# Patient Record
Sex: Male | Born: 1980
Health system: Southern US, Community
[De-identification: ages and names within clinical notes are randomized; demographics above are authoritative.]

---

## 2016-01-16 DIAGNOSIS — H1011 Acute atopic conjunctivitis, right eye: Secondary | ICD-10-CM | POA: Diagnosis not present

## 2017-03-06 DIAGNOSIS — I208 Other forms of angina pectoris: Secondary | ICD-10-CM | POA: Diagnosis not present

## 2017-03-23 DIAGNOSIS — R0789 Other chest pain: Secondary | ICD-10-CM | POA: Diagnosis not present

## 2017-04-27 ENCOUNTER — Ambulatory Visit (INDEPENDENT_AMBULATORY_CARE_PROVIDER_SITE_OTHER): Payer: BLUE CROSS/BLUE SHIELD | Admitting: *Deleted

## 2017-04-27 DIAGNOSIS — Z23 Encounter for immunization: Secondary | ICD-10-CM | POA: Diagnosis not present

## 2017-05-06 DIAGNOSIS — E785 Hyperlipidemia, unspecified: Secondary | ICD-10-CM | POA: Diagnosis not present

## 2017-06-08 ENCOUNTER — Ambulatory Visit (INDEPENDENT_AMBULATORY_CARE_PROVIDER_SITE_OTHER): Payer: BLUE CROSS/BLUE SHIELD | Admitting: *Deleted

## 2017-06-08 DIAGNOSIS — Z23 Encounter for immunization: Secondary | ICD-10-CM | POA: Diagnosis not present

## 2018-04-29 DIAGNOSIS — E785 Hyperlipidemia, unspecified: Secondary | ICD-10-CM | POA: Diagnosis not present

## 2018-04-29 DIAGNOSIS — Z0000 Encounter for general adult medical examination without abnormal findings: Secondary | ICD-10-CM | POA: Diagnosis not present

## 2018-05-31 DIAGNOSIS — R748 Abnormal levels of other serum enzymes: Secondary | ICD-10-CM | POA: Diagnosis not present

## 2018-06-21 ENCOUNTER — Encounter: Payer: Self-pay | Admitting: Emergency Medicine

## 2018-06-21 ENCOUNTER — Other Ambulatory Visit: Payer: Self-pay

## 2018-06-21 ENCOUNTER — Emergency Department
Admission: EM | Admit: 2018-06-21 | Discharge: 2018-06-21 | Disposition: A | Discharge status: Home / Self Care | Payer: BLUE CROSS/BLUE SHIELD | Attending: Emergency Medicine | Admitting: Emergency Medicine

## 2018-06-21 ENCOUNTER — Emergency Department: Payer: BLUE CROSS/BLUE SHIELD

## 2018-06-21 DIAGNOSIS — M545 Low back pain, unspecified: Secondary | ICD-10-CM

## 2018-06-21 DIAGNOSIS — M549 Dorsalgia, unspecified: Secondary | ICD-10-CM | POA: Diagnosis not present

## 2018-06-21 DIAGNOSIS — M62830 Muscle spasm of back: Secondary | ICD-10-CM | POA: Diagnosis not present

## 2018-06-21 MED ORDER — OXYCODONE-ACETAMINOPHEN 5-325 MG PO TABS: 1.0000 | ORAL_TABLET | Freq: Four times a day (QID) | ORAL | 0 refills | Status: AC | PRN

## 2018-06-21 MED ORDER — METHOCARBAMOL 500 MG PO TABS
ORAL_TABLET | ORAL | 0 refills | Status: AC
Start: 2018-06-21 — End: ?

## 2018-06-21 MED ORDER — ORPHENADRINE CITRATE 30 MG/ML IJ SOLN
60.0000 mg | Freq: Two times a day (BID) | INTRAMUSCULAR | Status: DC
Start: 2018-06-21 — End: 2018-06-21
  Administered 2018-06-21: 60 mg via INTRAMUSCULAR
  Filled 2018-06-21: qty 2

## 2018-06-21 MED ORDER — PREDNISONE 10 MG PO TABS: ORAL_TABLET | ORAL | 0 refills | Status: AC

## 2018-06-21 MED ORDER — OXYCODONE-ACETAMINOPHEN 5-325 MG PO TABS
1.0000 | ORAL_TABLET | Freq: Once | ORAL | Status: AC
  Administered 2018-06-21: 1 via ORAL
  Filled 2018-06-21: qty 1

## 2018-06-21 MED ORDER — DEXAMETHASONE SODIUM PHOSPHATE 10 MG/ML IJ SOLN
10.0000 mg | Freq: Once | INTRAMUSCULAR | Status: AC
  Administered 2018-06-21: 10 mg via INTRAMUSCULAR
  Filled 2018-06-21: qty 1

## 2018-06-21 NOTE — ED Notes (Signed)
First Nurse Note: Patient complaining of lower back pain, helped from vehicle into WC.  Unable to sit on left side.

## 2018-06-21 NOTE — ED Notes (Signed)
See triage note presents with family with lower back pain  States hx of back pain which flares up occasionally   States this am started 2 days ago  Thinks he may have twisted wrong  States pain is non radiating  Also took 3 OTC Aleve PTA

## 2018-06-21 NOTE — Discharge Instructions (Addendum)
Follow-up with your primary care provider if any continued problems in 4 to 5 days.  Begin taking medication as directed.  Robaxin 500 mg 1 or 2 tablets every 6 hours as needed for muscle spasms, Percocet as needed for pain, and begin taking prednisone as a tapering dose as directed.  You may use ice or heat to your back as needed for discomfort.  Use crutches as needed for as support.  Return to the emergency department if any incontinence of bowel or bladder.

## 2018-06-21 NOTE — ED Provider Notes (Signed)
[facility] Emergency Department Provider Note  ____________________________________________   First MD Initiated Contact with Patient 06/21/18 [phone]     (approximate)  I have reviewed the triage vital signs and the nursing notes.   HISTORY  Chief Complaint Back Pain   HPI Hector Brown is a 37 y.o. male presents to the ED with complaint of lower back pain that started last evening.  Patient describes it as a "flareup".  Patient states that he has had problems with his back in the past with the last episode 1 year ago.  He states that he has seen a chiropractor in the past who told him that his muscles were "tight".  Patient denies any paresthesias, saddle anesthesias or incontinence of bowel or bladder.  Patient states that he had to "crawl to the car this morning" he states that he is weak in his lower extremities due to his back pain.  He denies any urinary symptoms.  He is here with a family member.  Currently rates his pain as a 10/10.   History reviewed. No pertinent past medical history.  There are no active problems to display for this patient.   History reviewed. No pertinent surgical history.  Prior to Admission medications   Medication Sig Start Date End Date Taking? Authorizing Provider  methocarbamol (ROBAXIN) 500 MG tablet 1 to 2 tablets every 6 hours as needed for muscle spasms. 06/21/18   Tommi Rumps, PA-C  oxyCODONE-acetaminophen (PERCOCET) 5-325 MG tablet Take 1 tablet by mouth every 6 (six) hours as needed for severe pain. 06/21/18 06/21/19  Tommi Rumps, PA-C  predniSONE (DELTASONE) 10 MG tablet Take 6 tablets  today, on day 2 take 5 tablets, day 3 take 4 tablets, day 4 take 3 tablets, day 5 take  2 tablets and 1 tablet the last day 06/21/18   Tommi Rumps, PA-C    Allergies Iodine and Shellfish allergy  No family history on file.  Social History Social History   Tobacco Use  . Smoking status: Never Smoker  .  Smokeless tobacco: Never Used  Substance Use Topics  . Alcohol use: Yes  . Drug use: Never    Review of Systems Constitutional: No fever/chills Cardiovascular: Denies chest pain. Respiratory: Denies shortness of breath. Gastrointestinal: No abdominal pain.  No nausea, no vomiting.  Genitourinary: Negative for dysuria.  Negative for history of kidney stones. Musculoskeletal: Positive for low back pain. Skin: Negative for rash. Neurological: Negative for headaches, focal weakness or numbness.  ____________________________________________   PHYSICAL EXAM:  VITAL SIGNS: ED Triage Vitals  Enc Vitals Group     BP 06/21/18 0900 (!) 121/58     Pulse Rate 06/21/18 0900 92     Resp 06/21/18 0900 16     Temp 06/21/18 0900 98.3 F (36.8 C)     Temp Source 06/21/18 0900 Oral     SpO2 06/21/18 0900 100 %     Weight 06/21/18 0902 195 lb (88.5 kg)     Height 06/21/18 0902 5\' 10"  (1.778 m)     Head Circumference --      Peak Flow --      Pain Score 06/21/18 0901 10     Pain Loc --      Pain Edu? --      Excl. in GC? --    Constitutional: Alert and oriented. Well appearing and in no acute distress. Eyes: Conjunctivae are normal.  Head: Atraumatic. Neck: No stridor.   Cardiovascular: Normal  rate, regular rhythm. Grossly normal heart sounds.  Good peripheral circulation. Respiratory: Normal respiratory effort.  No retractions. Lungs CTAB. Gastrointestinal: Soft and nontender. No distention. Musculoskeletal: On examination of low back there is no gross deformity of the thoracic or lumbar spine.  There is diffuse tenderness on palpation of the paravertebral muscles bilaterally on the lower lumbar spine.  No ecchymosis or abrasions were seen.  No soft tissue swelling is present.  Range of motion is decreased with some active muscle spasms noted.  Straight leg raises are approximately 20 degrees bilaterally with patient having discomfort.  Good muscle strength bilaterally. Neurologic:  Normal  speech and language. No gross focal neurologic deficits are appreciated.  Reflexes are 2+ bilaterally.  No gait instability. Skin:  Skin is warm, dry and intact. No rash noted. Psychiatric: Mood and affect are normal. Speech and behavior are normal.  ____________________________________________   LABS (all labs ordered are listed, but only abnormal results are displayed)  Labs Reviewed - No data to display  RADIOLOGY  Official radiology report(s): Dg Lumbar Spine 2-3 Views  Result Date: 06/21/2018 CLINICAL DATA:  37 year old male with back pain starting 2 days ago. May have twisted wrong way. Initial encounter. EXAM: LUMBAR SPINE - 2-3 VIEW COMPARISON:  None. FINDINGS: Mild curvature lower thoracic and lumbar spine convex right. No compression fracture. Minimal left-sided T12-L1 disc space narrowing. Minimal L1-2 disc space narrowing. Otherwise no significant disc space narrowing noted. Sacroiliac joints appear intact. IMPRESSION: 1. Mild curvature lower thoracic and lumbar spine convex right. 2. No compression fracture. 3. Minimal left-sided T12-L1 disc space narrowing. 4. Minimal L1-2 disc space narrowing. 5. Otherwise no significant disc space narrowing noted. Electronically Signed   By: Lacy Duverney M.D.   On: 06/21/2018 11:03    ____________________________________________   PROCEDURES  Procedure(s) performed: None  Procedures  Critical Care performed: No  ____________________________________________   INITIAL IMPRESSION / ASSESSMENT AND PLAN / ED COURSE  As part of my medical decision making, I reviewed the following data within the electronic MEDICAL RECORD NUMBER Notes from prior ED visits and [place] Controlled Substance Database  Patient presents to the ED with complaint of low back pain that began last evening.  Patient states that this is a "flareup" of his low back pain.  He has had problems with low back pain in the past and has seen a chiropractor but has not seen an  orthopedist.  Patient was given Percocet and Norflex 60 mg IM.  Patient states that his pain went from a 10 down to possibly an 8 or a 9.5 during this time.  He denied any urinary symptoms or incontinence of bowel or bladder.  Neurologically he is intact.  There is no history of injury.  With talking to family and patient he was given an injection of Decadron 10 mg and discharged with prescription for methocarbamol 500 mg 1 or 2 tablets every 6 hours, prednisone tapering dose and Percocet every 6 hours as needed for pain.  He is to follow-up with Dr. Joice Lofts or [facility] acute care if any continued problems.  He is encouraged to use ice or heat to his back as needed for discomfort.  He was given precautions to return to the emergency department if any incontinence of bowel or bladder or saddle anesthesias.  ____________________________________________   FINAL CLINICAL IMPRESSION(S) / ED DIAGNOSES  Final diagnoses:  Acute bilateral low back pain without sciatica     ED Discharge Orders  Ordered    predniSONE (DELTASONE) 10 MG tablet     06/21/18 1231    methocarbamol (ROBAXIN) 500 MG tablet     06/21/18 1231    oxyCODONE-acetaminophen (PERCOCET) 5-325 MG tablet  Every 6 hours PRN     06/21/18 1231           Note:  This document was prepared using Dragon voice recognition software and may include unintentional dictation errors.    Tommi Rumps, PA-C 06/21/18 1655    Sharyn Creamer, MD 06/27/18 1517

## 2018-06-21 NOTE — ED Triage Notes (Signed)
Patient helped from POV, complaining of lower back pain starting last PM, describes it as a flare-up.  Denies any loss of bowel or bladder control.  States he had to crawl to car.  Sitting up on knees in WC.  Alert and oriented.

## 2018-06-28 DIAGNOSIS — M9903 Segmental and somatic dysfunction of lumbar region: Secondary | ICD-10-CM | POA: Diagnosis not present

## 2018-06-28 DIAGNOSIS — M9902 Segmental and somatic dysfunction of thoracic region: Secondary | ICD-10-CM | POA: Diagnosis not present

## 2018-06-28 DIAGNOSIS — M62830 Muscle spasm of back: Secondary | ICD-10-CM | POA: Diagnosis not present

## 2018-06-28 DIAGNOSIS — R293 Abnormal posture: Secondary | ICD-10-CM | POA: Diagnosis not present

## 2018-07-01 DIAGNOSIS — M9903 Segmental and somatic dysfunction of lumbar region: Secondary | ICD-10-CM | POA: Diagnosis not present

## 2018-07-01 DIAGNOSIS — R293 Abnormal posture: Secondary | ICD-10-CM | POA: Diagnosis not present

## 2018-07-01 DIAGNOSIS — M62830 Muscle spasm of back: Secondary | ICD-10-CM | POA: Diagnosis not present

## 2018-07-01 DIAGNOSIS — M9902 Segmental and somatic dysfunction of thoracic region: Secondary | ICD-10-CM | POA: Diagnosis not present

## 2018-07-01 DIAGNOSIS — M5126 Other intervertebral disc displacement, lumbar region: Secondary | ICD-10-CM | POA: Diagnosis not present

## 2018-07-05 DIAGNOSIS — M9902 Segmental and somatic dysfunction of thoracic region: Secondary | ICD-10-CM | POA: Diagnosis not present

## 2018-07-05 DIAGNOSIS — R293 Abnormal posture: Secondary | ICD-10-CM | POA: Diagnosis not present

## 2018-07-05 DIAGNOSIS — M9903 Segmental and somatic dysfunction of lumbar region: Secondary | ICD-10-CM | POA: Diagnosis not present

## 2018-07-05 DIAGNOSIS — M62830 Muscle spasm of back: Secondary | ICD-10-CM | POA: Diagnosis not present

## 2018-08-19 DIAGNOSIS — M9902 Segmental and somatic dysfunction of thoracic region: Secondary | ICD-10-CM | POA: Diagnosis not present

## 2018-08-19 DIAGNOSIS — M9903 Segmental and somatic dysfunction of lumbar region: Secondary | ICD-10-CM | POA: Diagnosis not present

## 2018-08-19 DIAGNOSIS — R293 Abnormal posture: Secondary | ICD-10-CM | POA: Diagnosis not present

## 2018-08-19 DIAGNOSIS — M62830 Muscle spasm of back: Secondary | ICD-10-CM | POA: Diagnosis not present

## 2019-05-13 DIAGNOSIS — Z0000 Encounter for general adult medical examination without abnormal findings: Secondary | ICD-10-CM | POA: Diagnosis not present

## 2019-05-24 DIAGNOSIS — E785 Hyperlipidemia, unspecified: Secondary | ICD-10-CM | POA: Diagnosis not present

## 2019-05-24 DIAGNOSIS — Z0000 Encounter for general adult medical examination without abnormal findings: Secondary | ICD-10-CM | POA: Diagnosis not present

## 2019-07-13 ENCOUNTER — Other Ambulatory Visit: Payer: Self-pay

## 2019-07-13 DIAGNOSIS — Z20822 Contact with and (suspected) exposure to covid-19: Secondary | ICD-10-CM

## 2019-07-15 LAB — NOVEL CORONAVIRUS, NAA: SARS-CoV-2, NAA: NOT DETECTED

## 2019-08-26 DIAGNOSIS — Z20828 Contact with and (suspected) exposure to other viral communicable diseases: Secondary | ICD-10-CM | POA: Diagnosis not present

## 2019-09-29 IMAGING — CR DG LUMBAR SPINE 2-3V
1 series · 3 of 3 positions shown · non-contrast
Comparison: None.

CLINICAL DATA: 37-year-old male with back pain starting 2 days ago.
May have twisted wrong way. Initial encounter.

EXAM:
LUMBAR SPINE - 2-3 VIEW

[Series 1: dg lumbar spine 2-3 views · 0.14mm/px · 3 of 3 slices shown]
[im 1/3]
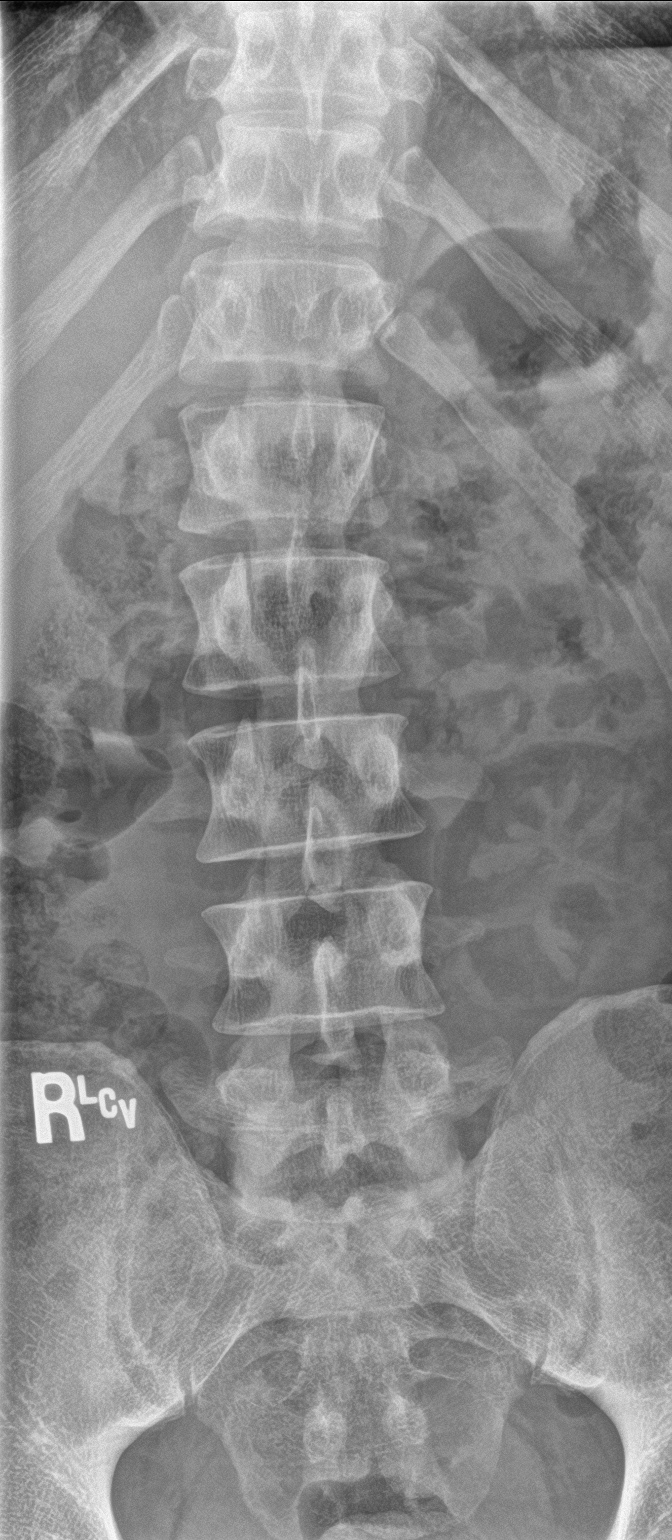
[im 2/3]
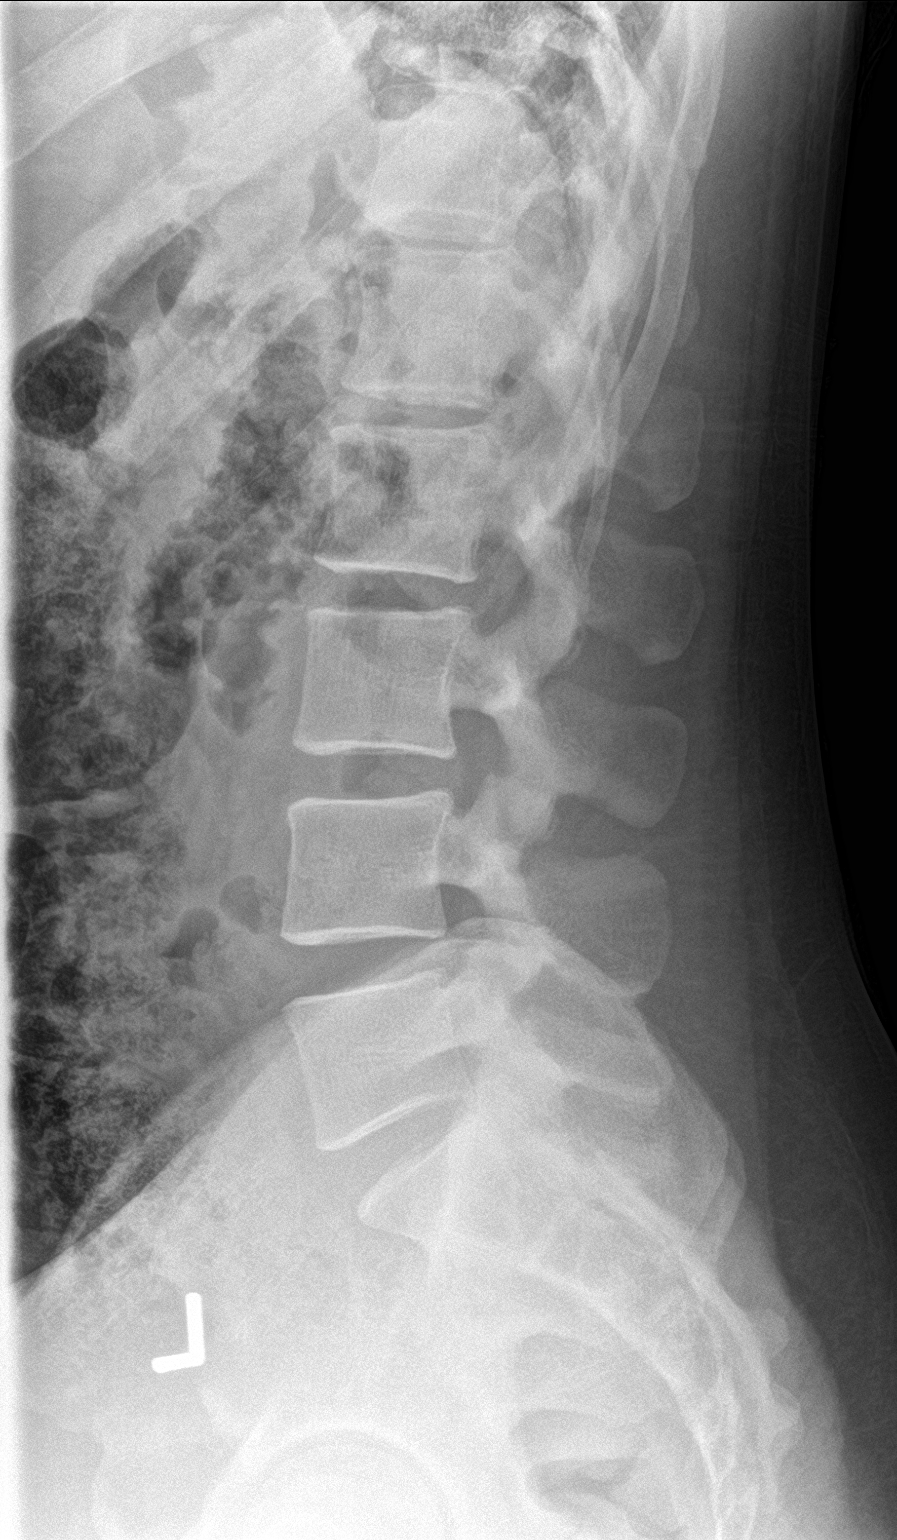
[im 3/3]
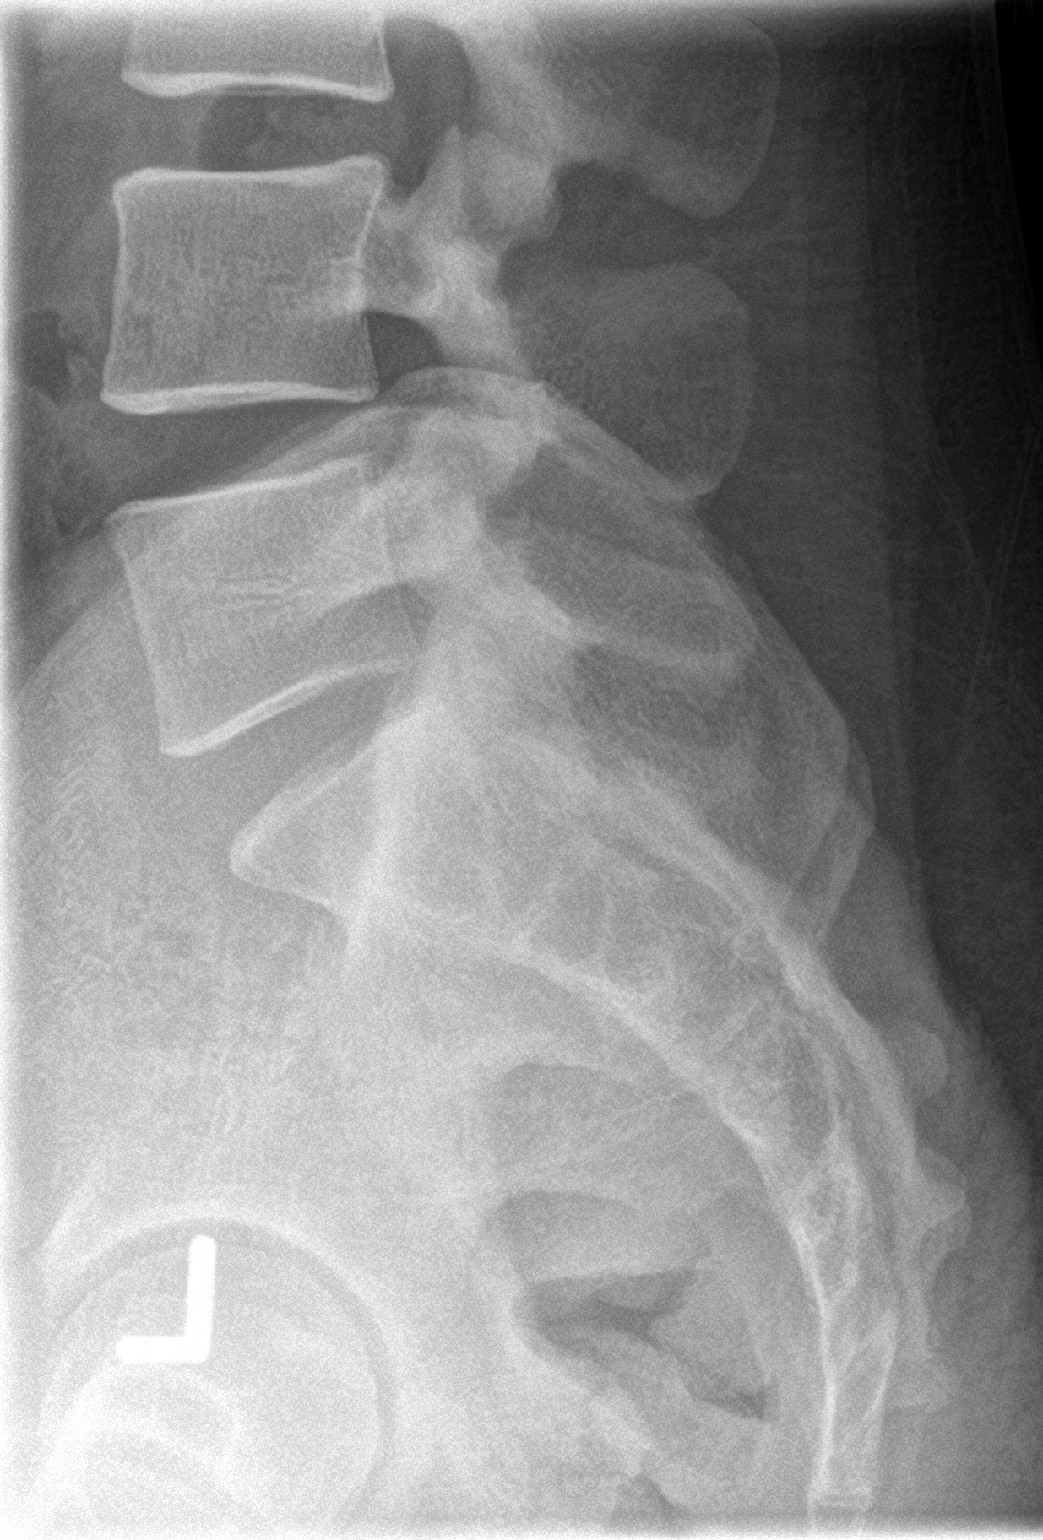

[3 of 3 positions shown; findings below may reference images not displayed]

FINDINGS: Mild curvature lower thoracic and lumbar spine convex right. No
compression fracture. Minimal left-sided T12-L1 disc space
narrowing. Minimal L1-2 disc space narrowing. Otherwise no
significant disc space narrowing noted. Sacroiliac joints appear
intact.
IMPRESSION: 1. Mild curvature lower thoracic and lumbar spine convex right.
2. No compression fracture.
3. Minimal left-sided T12-L1 disc space narrowing.
4. Minimal L1-2 disc space narrowing.
5. Otherwise no significant disc space narrowing noted.

## 2019-11-21 DIAGNOSIS — Z20828 Contact with and (suspected) exposure to other viral communicable diseases: Secondary | ICD-10-CM | POA: Diagnosis not present

## 2020-02-01 DIAGNOSIS — Z3009 Encounter for other general counseling and advice on contraception: Secondary | ICD-10-CM | POA: Diagnosis not present

## 2020-02-24 DIAGNOSIS — Z302 Encounter for sterilization: Secondary | ICD-10-CM | POA: Diagnosis not present

## 2020-04-09 DIAGNOSIS — Z20822 Contact with and (suspected) exposure to covid-19: Secondary | ICD-10-CM | POA: Diagnosis not present

## 2020-06-19 DIAGNOSIS — E785 Hyperlipidemia, unspecified: Secondary | ICD-10-CM | POA: Diagnosis not present

## 2020-06-19 DIAGNOSIS — Z13220 Encounter for screening for lipoid disorders: Secondary | ICD-10-CM | POA: Diagnosis not present

## 2020-06-19 DIAGNOSIS — Z125 Encounter for screening for malignant neoplasm of prostate: Secondary | ICD-10-CM | POA: Diagnosis not present

## 2020-06-19 DIAGNOSIS — Z0000 Encounter for general adult medical examination without abnormal findings: Secondary | ICD-10-CM | POA: Diagnosis not present

## 2022-06-30 DIAGNOSIS — Z0000 Encounter for general adult medical examination without abnormal findings: Secondary | ICD-10-CM | POA: Diagnosis not present

## 2022-06-30 DIAGNOSIS — R7309 Other abnormal glucose: Secondary | ICD-10-CM | POA: Diagnosis not present

## 2022-06-30 DIAGNOSIS — Z125 Encounter for screening for malignant neoplasm of prostate: Secondary | ICD-10-CM | POA: Diagnosis not present

## 2022-06-30 DIAGNOSIS — E785 Hyperlipidemia, unspecified: Secondary | ICD-10-CM | POA: Diagnosis not present

## 2023-03-24 DIAGNOSIS — F4322 Adjustment disorder with anxiety: Secondary | ICD-10-CM | POA: Diagnosis not present

## 2023-04-01 DIAGNOSIS — F4322 Adjustment disorder with anxiety: Secondary | ICD-10-CM | POA: Diagnosis not present

## 2023-04-17 ENCOUNTER — Emergency Department: Payer: BC Managed Care – PPO

## 2023-04-17 ENCOUNTER — Ambulatory Visit: Payer: BLUE CROSS/BLUE SHIELD

## 2023-04-17 ENCOUNTER — Other Ambulatory Visit: Payer: Self-pay

## 2023-04-17 ENCOUNTER — Emergency Department
Admission: EM | Admit: 2023-04-17 | Discharge: 2023-04-17 | Disposition: A | Discharge status: Home / Self Care | Payer: BC Managed Care – PPO | Attending: Emergency Medicine | Admitting: Emergency Medicine

## 2023-04-17 ENCOUNTER — Encounter: Payer: Self-pay | Admitting: Intensive Care

## 2023-04-17 DIAGNOSIS — R072 Precordial pain: Secondary | ICD-10-CM | POA: Insufficient documentation

## 2023-04-17 DIAGNOSIS — R0789 Other chest pain: Secondary | ICD-10-CM

## 2023-04-17 DIAGNOSIS — R079 Chest pain, unspecified: Secondary | ICD-10-CM | POA: Diagnosis not present

## 2023-04-17 LAB — CBC
HCT: 41.9 % (ref 39.0–52.0)
Hemoglobin: 13.1 g/dL (ref 13.0–17.0)
MCH: 26.3 pg (ref 26.0–34.0)
MCHC: 31.3 g/dL (ref 30.0–36.0)
MCV: 84 fL (ref 80.0–100.0)
Platelets: 207 10*3/uL (ref 150–400)
RBC: 4.99 MIL/uL (ref 4.22–5.81)
RDW: 13.5 % (ref 11.5–15.5)
WBC: 6.9 10*3/uL (ref 4.0–10.5)
nRBC: 0 % (ref 0.0–0.2)

## 2023-04-17 LAB — BASIC METABOLIC PANEL
Anion gap: 11 (ref 5–15)
BUN: 17 mg/dL (ref 6–20)
CO2: 26 mmol/L (ref 22–32)
Calcium: 9.4 mg/dL (ref 8.9–10.3)
Chloride: 99 mmol/L (ref 98–111)
Creatinine, Ser: 0.99 mg/dL (ref 0.61–1.24)
GFR, Estimated: >60 mL/min (ref >60)
Glucose, Bld: 82 mg/dL (ref 70–99)
Potassium: 4.1 mmol/L (ref 3.5–5.1)
Sodium: 136 mmol/L (ref 135–145)

## 2023-04-17 LAB — TROPONIN I (HIGH SENSITIVITY): Troponin I (High Sensitivity): 2 ng/L (ref <18)

## 2023-04-17 NOTE — ED Provider Notes (Signed)
[facility] Provider Note   Event Date/Time   First MD Initiated Contact with Patient 04/17/23 1348     (approximate) History  Chest Pain  HPI Hector Brown is a 42 y.o. male with no stated past medical history presents for central and left parasternal chest pain that has been present over the last 5 days and only mildly improving.  Patient has been using over-the-counter analgesics including ibuprofen and Tylenol for this pain and states that it has somewhat improved.  Patient states that this pain is somewhat improved when he is reclining and gets worse when leaning forward. ROS: Patient currently denies any vision changes, tinnitus, difficulty speaking, facial droop, sore throat, shortness of breath, abdominal pain, nausea/vomiting/diarrhea, dysuria, or weakness/numbness/paresthesias in any extremity   Physical Exam  Triage Vital Signs: ED Triage Vitals  Encounter Vitals Group     BP 04/17/23 1205 130/86     Systolic BP Percentile --      Diastolic BP Percentile --      Pulse Rate 04/17/23 1205 66     Resp 04/17/23 1205 18     Temp 04/17/23 1205 98 F (36.7 C)     Temp Source 04/17/23 1205 Oral     SpO2 04/17/23 1205 100 %     Weight 04/17/23 1203 165 lb (74.8 kg)     Height 04/17/23 1203 5\' 10"  (1.778 m)     Head Circumference --      Peak Flow --      Pain Score 04/17/23 1202 2     Pain Loc --      Pain Education --      Exclude from Growth Chart --    Most recent vital signs: Vitals:   04/17/23 1205  BP: 130/86  Pulse: 66  Resp: 18  Temp: 98 F (36.7 C)  SpO2: 100%   General: Awake, oriented x4. CV:  Good peripheral perfusion.  Bedside echocardiogram performed by this provider shows no decreased echogenicity between the heart muscle and pericardium, no signs of fluid collection Resp:  Normal effort.  Abd:  No distention.  Other:  Middle-aged, well-developed well-nourished African-American male resting comfortably in no acute distress ED  Results / Procedures / Treatments  Labs (all labs ordered are listed, but only abnormal results are displayed) Labs Reviewed  BASIC METABOLIC PANEL  CBC  PROTIME-INR  TROPONIN I (HIGH SENSITIVITY)  TROPONIN I (HIGH SENSITIVITY)   EKG ED ECG REPORT I, Merwyn Katos, the attending physician, personally viewed and interpreted this ECG. Date: 04/17/2023 EKG Time: 1206 Rate: 62 Rhythm: normal sinus rhythm QRS Axis: normal Intervals: normal ST/T Wave abnormalities: normal Narrative Interpretation: no evidence of acute ischemia RADIOLOGY ED MD interpretation: 2 view chest x-ray interpreted by me shows no evidence of acute abnormalities including no pneumonia, pneumothorax, or widened mediastinum -Agree with radiology assessment Official radiology report(s): DG Chest 2 View  Result Date: 04/17/2023 CLINICAL DATA:  Chest pain. EXAM: CHEST - 2 VIEW COMPARISON:  None Available. FINDINGS: The cardiomediastinal contours are normal. The lungs are clear. Pulmonary vasculature is normal. No consolidation, pleural effusion, or pneumothorax. No acute osseous abnormalities are seen. IMPRESSION: No active cardiopulmonary disease. Electronically Signed   By: Narda Rutherford M.D.   On: 04/17/2023 13:17   PROCEDURES: Critical Care performed: No .1-3 Lead EKG Interpretation  Performed by: Merwyn Katos, MD Authorized by: Merwyn Katos, MD     Interpretation: normal     ECG rate:  71  ECG rate assessment: normal     Rhythm: sinus rhythm     Ectopy: none     Conduction: normal    MEDICATIONS ORDERED IN ED: Medications - No data to display IMPRESSION / MDM / ASSESSMENT AND PLAN / ED COURSE  I reviewed the triage vital signs and the nursing notes.                             The patient is on the cardiac monitor to evaluate for evidence of arrhythmia and/or significant heart rate changes. Patient's presentation is most consistent with acute presentation with potential threat to life or  bodily function. This patient presents with atypical chest pain, most likely secondary to musculoskeletal injury. Differential diagnosis includes rib fracture, costochondritis, sternal fracture. Low suspicion for ACS, acute PE (PERC negative), pericarditis / myocarditis, thoracic aortic dissection, pneumothorax, pneumonia or other acute infectious process. Presentation not consistent with other acute, emergent causes of chest pain at this time. No indication for cardiac enzyme testing. Plan to order CXR to evaluate for acute cardiopulmonary causes.  Plan: EKG, CXR, pain control  Dispo: Discharge home with home care   FINAL CLINICAL IMPRESSION(S) / ED DIAGNOSES   Final diagnoses:  Chest wall pain   Rx / DC Orders   ED Discharge Orders          Ordered    Ambulatory Referral to Primary Care (Establish Care)        04/17/23 1532           Note:  This document was prepared using Dragon voice recognition software and may include unintentional dictation errors.   Merwyn Katos, MD 04/17/23 1630

## 2023-04-17 NOTE — ED Notes (Signed)
Patient declined discharge vital signs. 

## 2023-04-17 NOTE — ED Triage Notes (Signed)
Patient c/o central chest pain that started Sunday, September 1st. Reports heavy lifting Friday. A&O x4 during triage.

## 2023-04-20 DIAGNOSIS — F4322 Adjustment disorder with anxiety: Secondary | ICD-10-CM | POA: Diagnosis not present

## 2023-04-20 NOTE — Group Note (Deleted)

## 2023-06-19 ENCOUNTER — Other Ambulatory Visit: Payer: Self-pay | Admitting: Medical Genetics

## 2023-06-19 DIAGNOSIS — Z006 Encounter for examination for normal comparison and control in clinical research program: Secondary | ICD-10-CM

## 2023-06-26 ENCOUNTER — Other Ambulatory Visit
Admission: RE | Admit: 2023-06-26 | Discharge: 2023-06-26 | Disposition: A | Discharge status: Home / Self Care | Payer: Self-pay | Source: Ambulatory Visit | Attending: Medical Genetics | Admitting: Medical Genetics

## 2023-06-26 DIAGNOSIS — Z006 Encounter for examination for normal comparison and control in clinical research program: Secondary | ICD-10-CM | POA: Insufficient documentation

## 2023-07-06 DIAGNOSIS — Z125 Encounter for screening for malignant neoplasm of prostate: Secondary | ICD-10-CM | POA: Diagnosis not present

## 2023-07-06 DIAGNOSIS — Z0000 Encounter for general adult medical examination without abnormal findings: Secondary | ICD-10-CM | POA: Diagnosis not present

## 2023-07-06 DIAGNOSIS — E785 Hyperlipidemia, unspecified: Secondary | ICD-10-CM | POA: Diagnosis not present

## 2023-07-06 DIAGNOSIS — R7309 Other abnormal glucose: Secondary | ICD-10-CM | POA: Diagnosis not present

## 2023-07-07 LAB — GENECONNECT MOLECULAR SCREEN: Genetic Analysis Overall Interpretation: NEGATIVE

## 2024-07-22 DIAGNOSIS — M549 Dorsalgia, unspecified: Secondary | ICD-10-CM | POA: Diagnosis not present

## 2024-07-22 DIAGNOSIS — Z0000 Encounter for general adult medical examination without abnormal findings: Secondary | ICD-10-CM | POA: Diagnosis not present

## 2024-07-22 DIAGNOSIS — R7309 Other abnormal glucose: Secondary | ICD-10-CM | POA: Diagnosis not present

## 2024-07-22 DIAGNOSIS — Z125 Encounter for screening for malignant neoplasm of prostate: Secondary | ICD-10-CM | POA: Diagnosis not present

## 2024-07-22 DIAGNOSIS — E785 Hyperlipidemia, unspecified: Secondary | ICD-10-CM | POA: Diagnosis not present

## 2024-07-26 ENCOUNTER — Other Ambulatory Visit: Payer: Self-pay | Admitting: Family Medicine

## 2024-07-26 DIAGNOSIS — E785 Hyperlipidemia, unspecified: Secondary | ICD-10-CM

## 2024-08-02 ENCOUNTER — Ambulatory Visit
Admission: RE | Admit: 2024-08-02 | Discharge: 2024-08-02 | Disposition: A | Discharge status: Home / Self Care | Payer: Self-pay | Source: Ambulatory Visit | Attending: Family Medicine | Admitting: Family Medicine

## 2024-08-02 DIAGNOSIS — E785 Hyperlipidemia, unspecified: Secondary | ICD-10-CM
